# Patient Record
Sex: Male | Born: 1980 | Race: Black or African American | Hispanic: No | Smoking: Never smoker
Health system: Southern US, Community
[De-identification: ages and names within clinical notes are randomized; demographics above are authoritative.]

---

## 2011-03-07 ENCOUNTER — Emergency Department (HOSPITAL_COMMUNITY)
Admission: EM | Admit: 2011-03-07 | Discharge: 2011-03-07 | Disposition: A | Discharge status: Home / Self Care | Payer: Medicaid Other | Attending: Emergency Medicine | Admitting: Emergency Medicine

## 2011-03-07 ENCOUNTER — Emergency Department (HOSPITAL_COMMUNITY): Payer: Medicaid Other

## 2011-03-07 DIAGNOSIS — M25529 Pain in unspecified elbow: Secondary | ICD-10-CM | POA: Insufficient documentation

## 2011-03-07 DIAGNOSIS — S5000XA Contusion of unspecified elbow, initial encounter: Secondary | ICD-10-CM | POA: Insufficient documentation

## 2011-03-07 DIAGNOSIS — M25429 Effusion, unspecified elbow: Secondary | ICD-10-CM | POA: Insufficient documentation

## 2011-03-07 DIAGNOSIS — S52123A Displaced fracture of head of unspecified radius, initial encounter for closed fracture: Secondary | ICD-10-CM | POA: Insufficient documentation

## 2011-06-16 ENCOUNTER — Emergency Department (HOSPITAL_COMMUNITY)
Admission: EM | Admit: 2011-06-16 | Discharge: 2011-06-16 | Disposition: A | Discharge status: Home / Self Care | Payer: Self-pay | Attending: Emergency Medicine | Admitting: Emergency Medicine

## 2011-06-16 ENCOUNTER — Encounter (HOSPITAL_COMMUNITY): Payer: Self-pay

## 2011-06-16 DIAGNOSIS — R110 Nausea: Secondary | ICD-10-CM | POA: Insufficient documentation

## 2011-06-16 DIAGNOSIS — R300 Dysuria: Secondary | ICD-10-CM | POA: Insufficient documentation

## 2011-06-16 DIAGNOSIS — R109 Unspecified abdominal pain: Secondary | ICD-10-CM | POA: Insufficient documentation

## 2011-06-16 DIAGNOSIS — N3943 Post-void dribbling: Secondary | ICD-10-CM | POA: Insufficient documentation

## 2011-06-16 DIAGNOSIS — R3915 Urgency of urination: Secondary | ICD-10-CM | POA: Insufficient documentation

## 2011-06-16 DIAGNOSIS — R350 Frequency of micturition: Secondary | ICD-10-CM | POA: Insufficient documentation

## 2011-06-16 DIAGNOSIS — R197 Diarrhea, unspecified: Secondary | ICD-10-CM | POA: Insufficient documentation

## 2011-06-16 LAB — URINALYSIS, ROUTINE W REFLEX MICROSCOPIC
Glucose, UA: NEGATIVE mg/dL
Ketones, ur: NEGATIVE mg/dL
Protein, ur: 100 mg/dL — AB
pH: 6 (ref 5.0–8.0)

## 2011-06-16 LAB — URINE MICROSCOPIC-ADD ON

## 2011-06-16 LAB — GLUCOSE, CAPILLARY: Glucose-Capillary: 91 mg/dL (ref 70–99)

## 2011-06-16 MED ORDER — DICYCLOMINE HCL 10 MG PO CAPS: 10.0000 mg | ORAL_CAPSULE | Freq: Three times a day (TID) | ORAL | Status: AC

## 2011-06-16 MED ORDER — DICYCLOMINE HCL 10 MG PO CAPS
10.0000 mg | ORAL_CAPSULE | ORAL | Status: AC
  Administered 2011-06-16: 10 mg via ORAL
  Filled 2011-06-16: qty 1

## 2011-06-16 NOTE — ED Notes (Signed)
Pt states that for the past few days he has been having abdominal cramping and pain and has been also having intermittent constipation. Pt states that he has also been having frequency and urgency with urination. No meds pta.

## 2011-06-16 NOTE — ED Provider Notes (Signed)
History     CSN: [id number]  Arrival date & time 06/16/11  0714   First MD Initiated Contact with Patient 06/16/11 [phone]      Chief Complaint  Patient presents with  . Abdominal Pain    (Consider location/radiation/quality/duration/timing/severity/associated sxs/prior treatment) HPI Comments: Patient here with multiple complaints including frequent urination and urgency, states that when he goes that he sometimes dribbles, denies urethral discharge or burning.  Also here with episodic abdominal cramping - states this started about 3 days ago - states that he thought he was getting a stomach virus - denies fever, chills, reports nausea but no vomiting - reports diarrhea starting last night - is able to eat without difficulty.    Patient is a 31 y.o. male presenting with abdominal pain. The history is provided by the patient. No language interpreter was used.  Abdominal Pain The primary symptoms of the illness include abdominal pain, nausea, diarrhea and dysuria. The primary symptoms of the illness do not include fever, fatigue, shortness of breath, vomiting, hematemesis or hematochezia. The current episode started more than 2 days ago. The onset of the illness was gradual. The problem has been gradually worsening.  The dysuria is associated with urgency. The dysuria is not associated with hematuria or frequency.  The patient states that she believes she is currently not pregnant. The patient has not had a change in bowel habit. Additional symptoms associated with the illness include urgency. Symptoms associated with the illness do not include chills, anorexia, diaphoresis, heartburn, constipation, hematuria, frequency or back pain.    History reviewed. No pertinent past medical history.  History reviewed. No pertinent past surgical history.  History reviewed. No pertinent family history.  History  Substance Use Topics  . Smoking status: Never Smoker   . Smokeless tobacco: Not on file    . Alcohol Use: No      Review of Systems  Constitutional: Negative for fever, chills, diaphoresis and fatigue.  Respiratory: Negative for shortness of breath.   Gastrointestinal: Positive for nausea, abdominal pain and diarrhea. Negative for heartburn, vomiting, constipation, hematochezia, anorexia and hematemesis.  Genitourinary: Positive for dysuria and urgency. Negative for frequency and hematuria.  Musculoskeletal: Negative for back pain.  All other systems reviewed and are negative.    Allergies  Review of patient's allergies indicates no known allergies.  Home Medications  No current outpatient prescriptions on file.  BP 142/81  Pulse 80  Temp(Src) 97.9 F (36.6 C) (Oral)  Resp 20  SpO2 99%  Physical Exam  Nursing note and vitals reviewed. Constitutional: He appears well-developed and well-nourished. No distress.  HENT:  Head: Normocephalic and atraumatic.  Right Ear: External ear normal.  Left Ear: External ear normal.  Nose: Nose normal.  Mouth/Throat: Oropharynx is clear and moist. No oropharyngeal exudate.  Eyes: Conjunctivae are normal. Pupils are equal, round, and reactive to light. No scleral icterus.  Neck: Normal range of motion. Neck supple.  Cardiovascular: Normal rate, regular rhythm and normal heart sounds.  Exam reveals no gallop and no friction rub.   No murmur heard. Pulmonary/Chest: Effort normal and breath sounds normal. No respiratory distress. He exhibits no tenderness.  Abdominal: Soft. Bowel sounds are normal. He exhibits no distension. There is no tenderness.  Musculoskeletal: Normal range of motion.  Lymphadenopathy:    He has no cervical adenopathy.  Neurological: He is alert. No cranial nerve deficit.  Skin: Skin is warm and dry.  Psychiatric: He has a normal mood and affect. His behavior is  normal. Judgment and thought content normal.    ED Course  Procedures (including critical care time)   Labs Reviewed  URINALYSIS, ROUTINE  W REFLEX MICROSCOPIC  POCT CBG MONITORING   No results found. Results for orders placed during the hospital encounter of 06/16/11  URINALYSIS, ROUTINE W REFLEX MICROSCOPIC      Component Value Range   Color, Urine YELLOW  YELLOW    APPearance CLEAR  CLEAR    Specific Gravity, Urine 1.030  1.005 - 1.030    pH 6.0  5.0 - 8.0    Glucose, UA NEGATIVE  NEGATIVE (mg/dL)   Hgb urine dipstick LARGE (*) NEGATIVE    Bilirubin Urine NEGATIVE  NEGATIVE    Ketones, ur NEGATIVE  NEGATIVE (mg/dL)   Protein, ur 409 (*) NEGATIVE (mg/dL)   Urobilinogen, UA 0.2  0.0 - 1.0 (mg/dL)   Nitrite NEGATIVE  NEGATIVE    Leukocytes, UA NEGATIVE  NEGATIVE   GLUCOSE, CAPILLARY      Component Value Range   Glucose-Capillary 91  70 - 99 (mg/dL)  URINE MICROSCOPIC-ADD ON      Component Value Range   Squamous Epithelial / LPF RARE  RARE    Crystals CA OXALATE CRYSTALS (*) NEGATIVE    Urine-Other MUCOUS PRESENT     No results found.   Abdominal pain    MDM  The patient reports improvement in pain after the bentyl - do not believe this to be an acute abdomen.  Given the diarrhea, likely a colitis vs gastroenteritis picture - no fever or chills.  Urine without infection but does show calcium oxalate crystal and large blood - he is not complaining of back pain and although a kidney stone could be part of this picture, I am doubting this at this point.  Will prescribe the bentyl and he will folllow up with PCP.        Izola Price [place], [place] 06/16/11 [phone]

## 2011-06-16 NOTE — ED Provider Notes (Signed)
Medical screening examination/treatment/procedure(s) were performed by non-physician practitioner and as supervising physician I was immediately available for consultation/collaboration.  Nica Friske R. Dymphna Wadley, MD 06/16/11 1404 

## 2012-08-27 ENCOUNTER — Emergency Department: Payer: Self-pay | Admitting: Emergency Medicine

## 2012-08-28 ENCOUNTER — Emergency Department: Payer: Self-pay | Admitting: Emergency Medicine

## 2012-08-28 LAB — COMPREHENSIVE METABOLIC PANEL
Albumin: 4.1 g/dL (ref 3.4–5.0)
BUN: 15 mg/dL (ref 7–18)
Chloride: 107 mmol/L (ref 98–107)
Co2: 32 mmol/L (ref 21–32)
EGFR (Non-African Amer.): >60
Glucose: 110 mg/dL — ABNORMAL HIGH (ref 65–99)
Osmolality: 281 (ref 275–301)
Potassium: 3.5 mmol/L (ref 3.5–5.1)
Total Protein: 8.1 g/dL (ref 6.4–8.2)

## 2012-08-28 LAB — URINALYSIS, COMPLETE
Bilirubin,UR: NEGATIVE
Ketone: NEGATIVE
Leukocyte Esterase: NEGATIVE
Ph: 7 (ref 4.5–8.0)
Specific Gravity: 1.021 (ref 1.003–1.030)
Squamous Epithelial: 1
WBC UR: 4 /HPF (ref 0–5)

## 2012-08-28 LAB — DRUG SCREEN, URINE
Amphetamines, Ur Screen: NEGATIVE (ref ?–1000)
Barbiturates, Ur Screen: NEGATIVE (ref ?–200)
Cocaine Metabolite,Ur ~~LOC~~: NEGATIVE (ref ?–300)
MDMA (Ecstasy)Ur Screen: NEGATIVE (ref ?–500)
Methadone, Ur Screen: NEGATIVE (ref ?–300)
Tricyclic, Ur Screen: NEGATIVE (ref ?–1000)

## 2012-08-28 LAB — CBC
HCT: 47.5 % (ref 40.0–52.0)
MCV: 89 fL (ref 80–100)
RDW: 14.5 % (ref 11.5–14.5)
WBC: 5.7 10*3/uL (ref 3.8–10.6)

## 2012-08-28 LAB — ETHANOL: Ethanol: <3 mg/dL

## 2012-08-28 LAB — TSH: Thyroid Stimulating Horm: 4.24 u[IU]/mL

## 2013-08-13 ENCOUNTER — Emergency Department: Payer: Self-pay | Admitting: Emergency Medicine

## 2013-08-13 LAB — CBC WITH DIFFERENTIAL/PLATELET
Basophil #: 0.1 x10 3/mm 3
Basophil %: 0.9 %
Eosinophil #: 0.3 x10 3/mm 3
Eosinophil %: 3.1 %
HCT: 48.3 %
HGB: 16.2 g/dL
Lymphocyte %: 21.8 %
Lymphs Abs: 1.8 x10 3/mm 3
MCH: 30.2 pg
MCHC: 33.5 g/dL
MCV: 90 fL
Monocyte #: 0.7 "x10 3/mm "
Monocyte %: 8.3 %
Neutrophil #: 5.5 x10 3/mm 3
Neutrophil %: 65.9 %
Platelet: 244 x10 3/mm 3
RBC: 5.36 x10 6/mm 3
RDW: 14.3 %
WBC: 8.4 x10 3/mm 3

## 2013-08-13 LAB — URINALYSIS, COMPLETE
Bacteria: NONE SEEN
Bilirubin,UR: NEGATIVE
Glucose,UR: NEGATIVE mg/dL
Ketone: NEGATIVE
Leukocyte Esterase: NEGATIVE
Nitrite: NEGATIVE
Ph: 6
Protein: 100
RBC,UR: 1 /HPF
Specific Gravity: 1.02
Squamous Epithelial: <1
WBC UR: 1 /HPF

## 2013-08-13 LAB — COMPREHENSIVE METABOLIC PANEL
ALBUMIN: 3.8 g/dL (ref 3.4–5.0)
ALT: 16 U/L (ref 12–78)
AST: 19 U/L (ref 15–37)
Alkaline Phosphatase: 66 U/L
Anion Gap: 0 — ABNORMAL LOW (ref 7–16)
BUN: 7 mg/dL (ref 7–18)
Bilirubin,Total: 0.3 mg/dL (ref 0.2–1.0)
CO2: 32 mmol/L (ref 21–32)
Calcium, Total: 8.8 mg/dL (ref 8.5–10.1)
Chloride: 108 mmol/L — ABNORMAL HIGH (ref 98–107)
Creatinine: 1.06 mg/dL (ref 0.60–1.30)
EGFR (African American): >60
GLUCOSE: 99 mg/dL (ref 65–99)
OSMOLALITY: 277 (ref 275–301)
Potassium: 4 mmol/L (ref 3.5–5.1)
SODIUM: 140 mmol/L (ref 136–145)
TOTAL PROTEIN: 7.6 g/dL (ref 6.4–8.2)

## 2013-08-13 LAB — LIPASE, BLOOD: Lipase: 241 U/L (ref 73–393)

## 2013-11-25 ENCOUNTER — Emergency Department: Payer: Self-pay | Admitting: Emergency Medicine

## 2013-11-25 LAB — COMPREHENSIVE METABOLIC PANEL
ALBUMIN: 3.2 g/dL — AB (ref 3.4–5.0)
ALK PHOS: 70 U/L
ALT: 15 U/L (ref 12–78)
AST: 21 U/L (ref 15–37)
Anion Gap: 9 (ref 7–16)
BUN: 9 mg/dL (ref 7–18)
Bilirubin,Total: 0.2 mg/dL (ref 0.2–1.0)
CALCIUM: 9.2 mg/dL (ref 8.5–10.1)
CHLORIDE: 110 mmol/L — AB (ref 98–107)
CO2: 26 mmol/L (ref 21–32)
CREATININE: 1.06 mg/dL (ref 0.60–1.30)
EGFR (African American): >60
EGFR (Non-African Amer.): >60
Glucose: 101 mg/dL — ABNORMAL HIGH (ref 65–99)
OSMOLALITY: 288 (ref 275–301)
POTASSIUM: 3.9 mmol/L (ref 3.5–5.1)
SODIUM: 145 mmol/L (ref 136–145)
TOTAL PROTEIN: 6.5 g/dL (ref 6.4–8.2)

## 2013-11-25 LAB — URINALYSIS, COMPLETE
BILIRUBIN, UR: NEGATIVE
Bacteria: NONE SEEN
Glucose,UR: NEGATIVE mg/dL (ref 0–75)
KETONE: NEGATIVE
Leukocyte Esterase: NEGATIVE
Nitrite: NEGATIVE
PROTEIN: NEGATIVE
Ph: 7 (ref 4.5–8.0)
RBC,UR: 1 /HPF (ref 0–5)
SPECIFIC GRAVITY: 1.012 (ref 1.003–1.030)
Squamous Epithelial: NONE SEEN

## 2013-11-25 LAB — CBC
HCT: 42.2 % (ref 40.0–52.0)
HGB: 13.6 g/dL (ref 13.0–18.0)
MCH: 29.3 pg (ref 26.0–34.0)
MCHC: 32.4 g/dL (ref 32.0–36.0)
MCV: 91 fL (ref 80–100)
PLATELETS: 212 10*3/uL (ref 150–440)
RBC: 4.66 10*6/uL (ref 4.40–5.90)
RDW: 15 % — ABNORMAL HIGH (ref 11.5–14.5)
WBC: 6.1 10*3/uL (ref 3.8–10.6)

## 2013-11-25 LAB — LIPASE, BLOOD: Lipase: 113 U/L (ref 73–393)

## 2014-06-01 ENCOUNTER — Emergency Department: Payer: Self-pay | Admitting: Emergency Medicine

## 2014-06-01 LAB — URINALYSIS, COMPLETE
Bacteria: NONE SEEN
Bilirubin,UR: NEGATIVE
Glucose,UR: NEGATIVE mg/dL (ref 0–75)
KETONE: NEGATIVE
LEUKOCYTE ESTERASE: NEGATIVE
Nitrite: NEGATIVE
Ph: 6 (ref 4.5–8.0)
SQUAMOUS EPITHELIAL: NONE SEEN
Specific Gravity: 1.026 (ref 1.003–1.030)
WBC UR: 1 /HPF (ref 0–5)

## 2014-07-11 ENCOUNTER — Emergency Department: Payer: Self-pay | Admitting: Emergency Medicine

## 2014-07-12 LAB — GC/CHLAMYDIA PROBE AMP
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
## 2015-10-07 IMAGING — US ABDOMEN ULTRASOUND LIMITED
1 series · 14 of 25 positions shown · non-contrast
Comparison: None.

CLINICAL DATA: Right upper quadrant pain

EXAM:
US ABDOMEN LIMITED - RIGHT UPPER QUADRANT

[Series 1: abdomen ultrasound limited · 0.25mm/px · 14 of 56 slices shown]
[im 1/56]
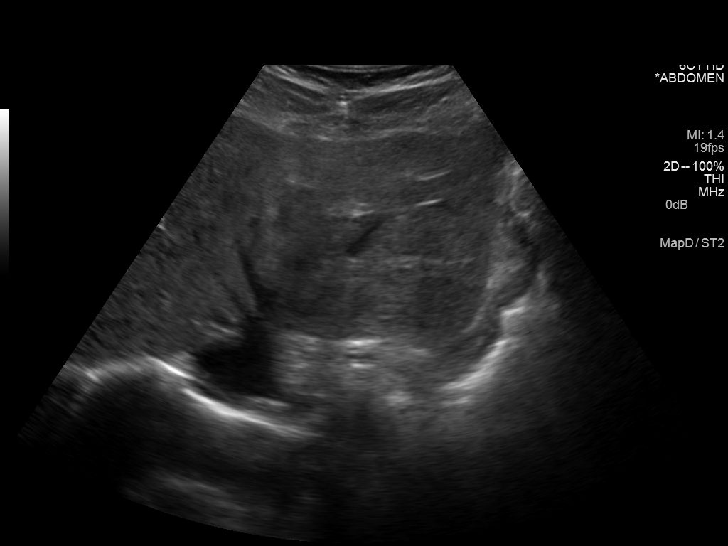
[im 5/56]
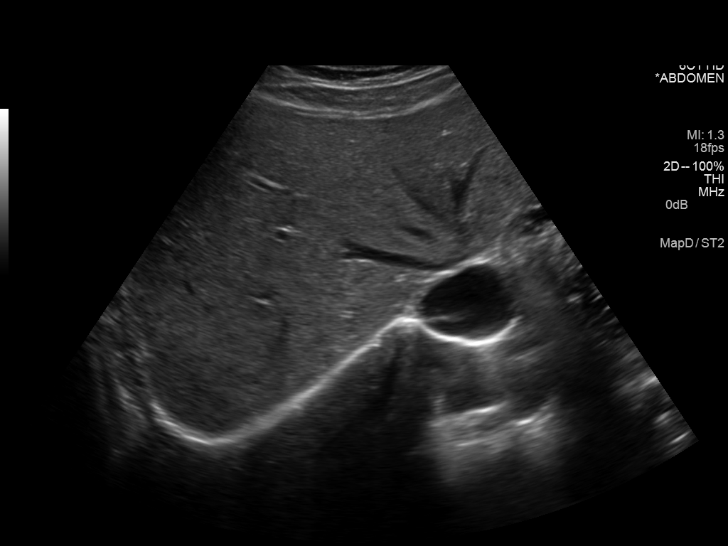
[im 10/56]
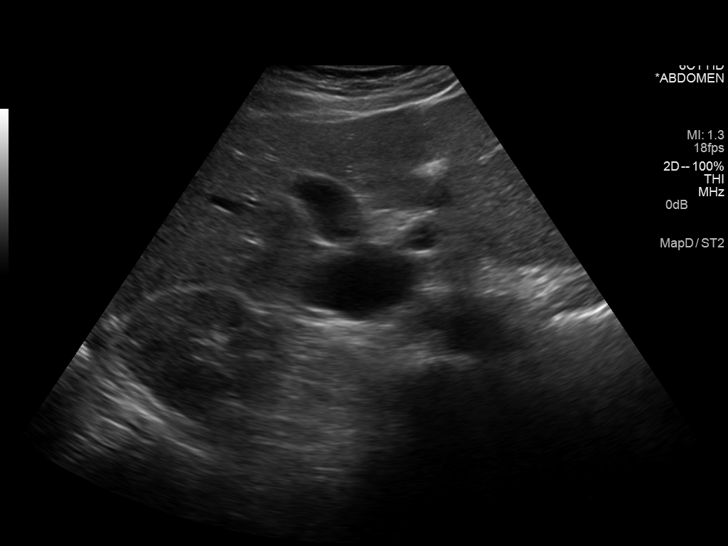
[im 14/56]
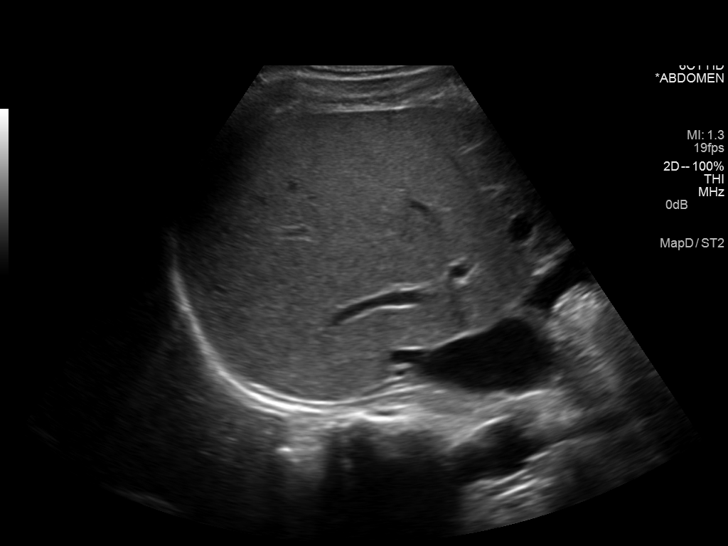
[im 19/56]
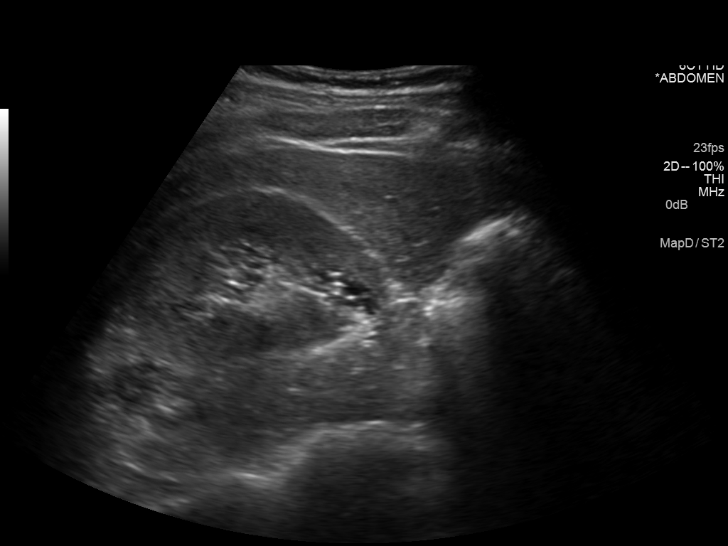
[im 21/56]
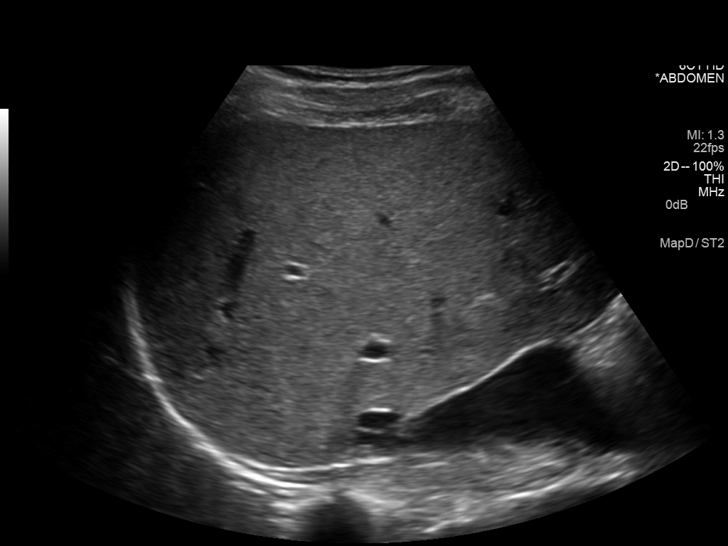
[im 26/56]
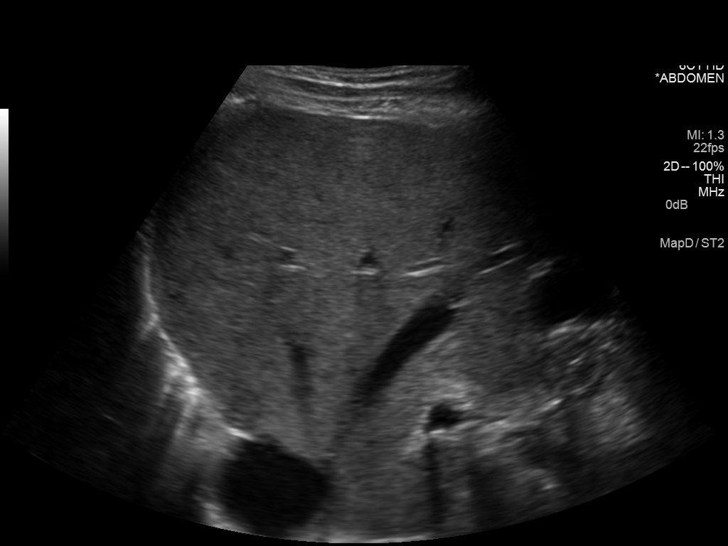
[im 30/56]
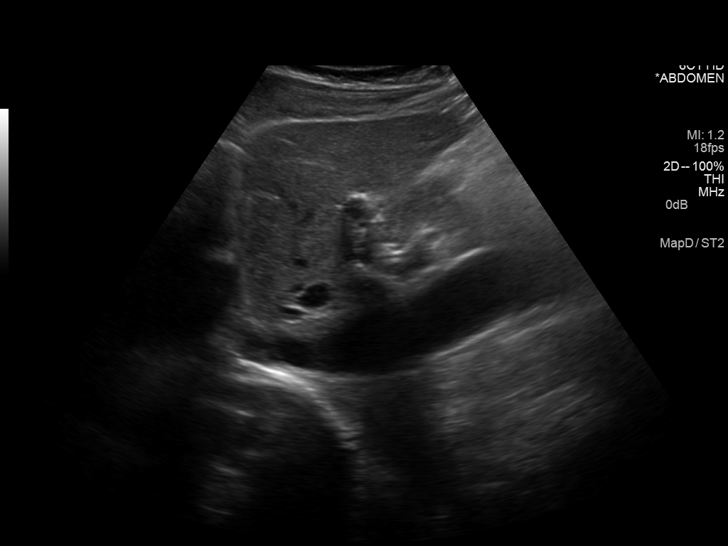
[im 35/56]
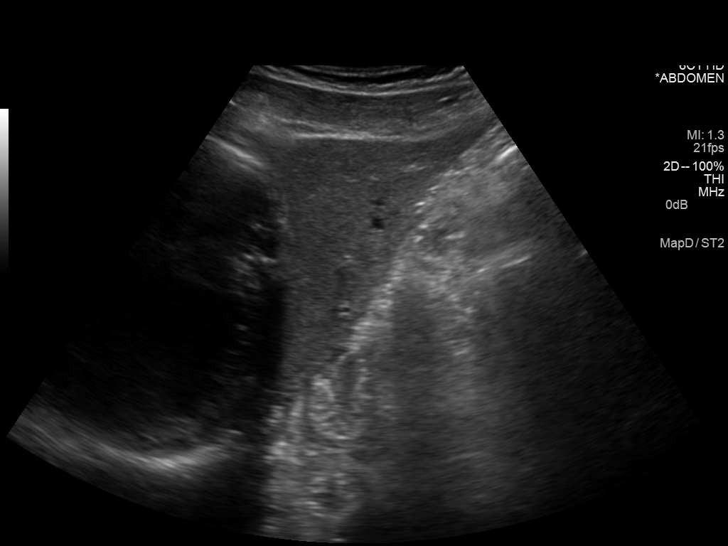
[im 37/56]
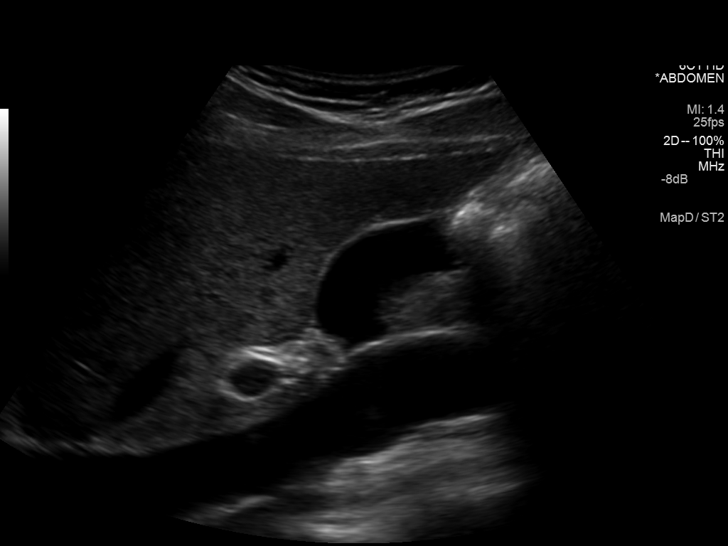
[im 42/56]
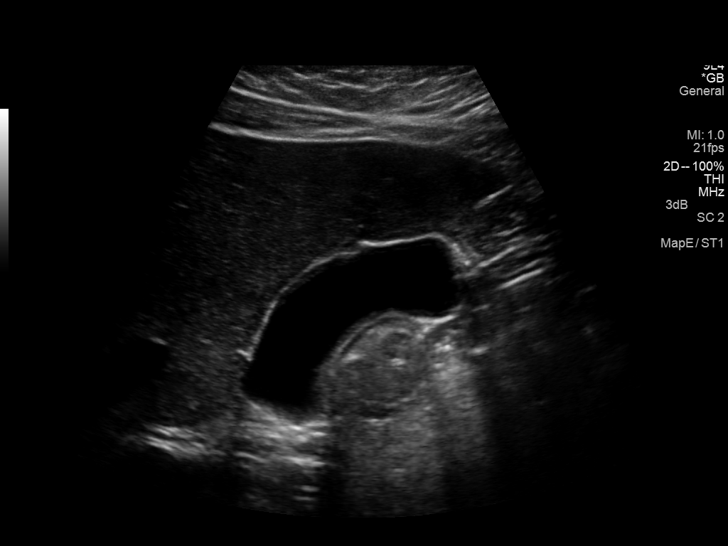
[im 46/56]
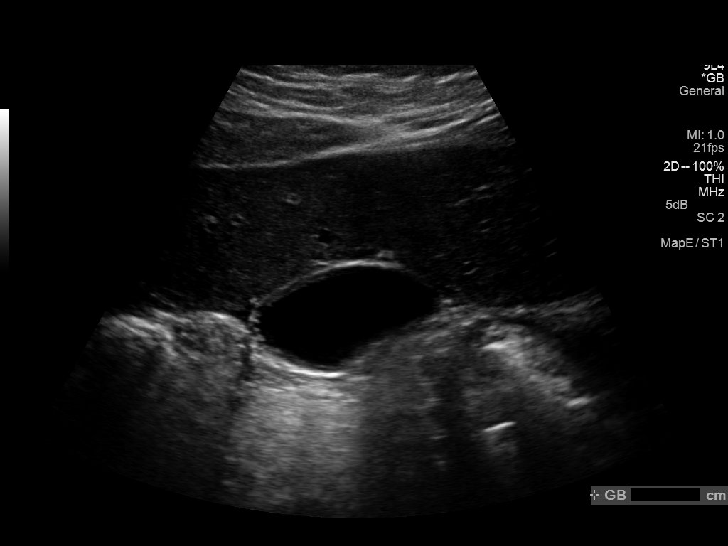
[im 51/56]
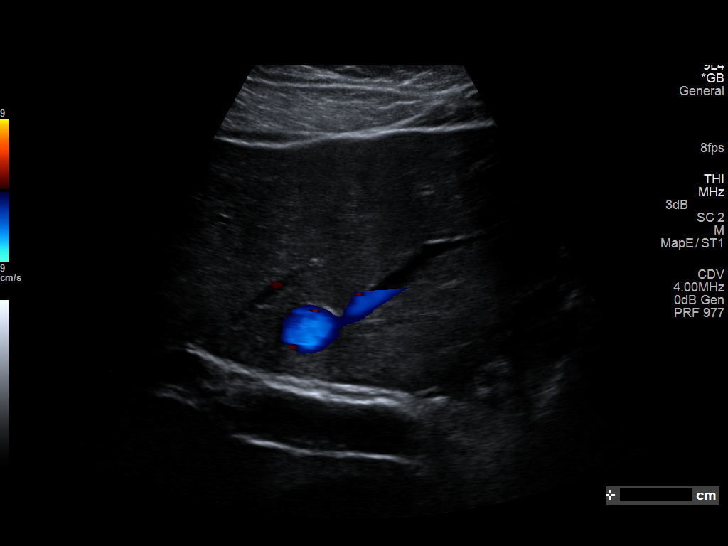
[im 56/56]
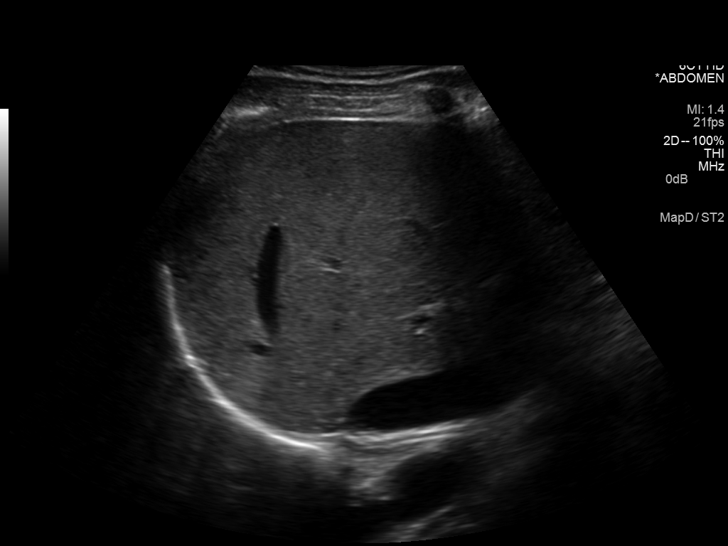

[14 of 25 positions shown; findings below may reference images not displayed]

FINDINGS: Gallbladder:

No gallstones or wall thickening visualized. No sonographic Murphy
sign noted.

Common bile duct:

Diameter: 2 mm

Liver:

No focal lesion identified. Within normal limits in parenchymal
echogenicity.
IMPRESSION: No acute finding by ultrasound

## 2017-07-19 ENCOUNTER — Emergency Department
Admission: EM | Admit: 2017-07-19 | Discharge: 2017-07-19 | Disposition: A | Discharge status: Home / Self Care | Payer: Self-pay | Attending: Emergency Medicine | Admitting: Emergency Medicine

## 2017-07-19 ENCOUNTER — Encounter: Payer: Self-pay | Admitting: Emergency Medicine

## 2017-07-19 ENCOUNTER — Other Ambulatory Visit: Payer: Self-pay

## 2017-07-19 ENCOUNTER — Emergency Department: Payer: Self-pay

## 2017-07-19 DIAGNOSIS — K122 Cellulitis and abscess of mouth: Secondary | ICD-10-CM | POA: Insufficient documentation

## 2017-07-19 LAB — CBC WITH DIFFERENTIAL/PLATELET
BASOS ABS: 0.1 10*3/uL (ref 0–0.1)
Basophils Relative: 1 %
Eosinophils Absolute: 0.1 10*3/uL (ref 0–0.7)
Eosinophils Relative: 2 %
HEMATOCRIT: 39.3 % — AB (ref 40.0–52.0)
HEMOGLOBIN: 13.3 g/dL (ref 13.0–18.0)
LYMPHS ABS: 1.6 10*3/uL (ref 1.0–3.6)
LYMPHS PCT: 31 %
MCH: 30.8 pg (ref 26.0–34.0)
MCHC: 33.9 g/dL (ref 32.0–36.0)
MCV: 90.7 fL (ref 80.0–100.0)
Monocytes Absolute: 0.4 10*3/uL (ref 0.2–1.0)
Monocytes Relative: 8 %
NEUTROS ABS: 2.9 10*3/uL (ref 1.4–6.5)
NEUTROS PCT: 58 %
Platelets: 255 10*3/uL (ref 150–440)
RBC: 4.33 MIL/uL — AB (ref 4.40–5.90)
RDW: 15.3 % — ABNORMAL HIGH (ref 11.5–14.5)
WBC: 5 10*3/uL (ref 3.8–10.6)

## 2017-07-19 LAB — BASIC METABOLIC PANEL
ANION GAP: 8 (ref 5–15)
BUN: 12 mg/dL (ref 6–20)
CHLORIDE: 107 mmol/L (ref 101–111)
CO2: 25 mmol/L (ref 22–32)
Calcium: 8.9 mg/dL (ref 8.9–10.3)
Creatinine, Ser: 1.01 mg/dL (ref 0.61–1.24)
GFR calc Af Amer: >60 mL/min (ref >60)
Glucose, Bld: 123 mg/dL — ABNORMAL HIGH (ref 65–99)
Potassium: 3.8 mmol/L (ref 3.5–5.1)
SODIUM: 140 mmol/L (ref 135–145)

## 2017-07-19 MED ORDER — IOPAMIDOL (ISOVUE-300) INJECTION 61%
75.0000 mL | Freq: Once | INTRAVENOUS | Status: AC | PRN
  Administered 2017-07-19: 75 mL via INTRAVENOUS
  Filled 2017-07-19: qty 75

## 2017-07-19 MED ORDER — CLINDAMYCIN HCL 150 MG PO CAPS: 300.0000 mg | ORAL_CAPSULE | Freq: Three times a day (TID) | ORAL | 0 refills | Status: AC

## 2017-07-19 NOTE — ED Notes (Signed)
Pt verbalized understanding of discharge instructions. NAD at this time. 
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
## 2017-07-19 NOTE — Discharge Instructions (Signed)
Follow-up with your regular doctor if you are not better in 5-7 days.  Take Tylenol and ibuprofen to help decrease the swelling.  Apply ice to the area also.  Due to the inflammation on the muscle tissue and the recent abscess we are repeating an antibiotic for you.  Be sure to eat yogurt while you take this medication so you will not get diarrhea.
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
## 2017-07-19 NOTE — ED Triage Notes (Signed)
Pt with right side jaw pain. Abscess back in jan.

## 2017-10-03 ENCOUNTER — Other Ambulatory Visit: Payer: Self-pay

## 2017-10-03 ENCOUNTER — Emergency Department
Admission: EM | Admit: 2017-10-03 | Discharge: 2017-10-03 | Disposition: A | Discharge status: Home / Self Care | Payer: Self-pay | Attending: Emergency Medicine | Admitting: Emergency Medicine

## 2017-10-03 ENCOUNTER — Encounter: Payer: Self-pay | Admitting: Emergency Medicine

## 2017-10-03 DIAGNOSIS — R300 Dysuria: Secondary | ICD-10-CM | POA: Insufficient documentation

## 2017-10-03 DIAGNOSIS — Z202 Contact with and (suspected) exposure to infections with a predominantly sexual mode of transmission: Secondary | ICD-10-CM | POA: Insufficient documentation

## 2017-10-03 DIAGNOSIS — Z113 Encounter for screening for infections with a predominantly sexual mode of transmission: Secondary | ICD-10-CM | POA: Insufficient documentation

## 2017-10-03 LAB — URINALYSIS, COMPLETE (UACMP) WITH MICROSCOPIC
BILIRUBIN URINE: NEGATIVE
Glucose, UA: NEGATIVE mg/dL
KETONES UR: NEGATIVE mg/dL
Leukocytes, UA: NEGATIVE
Nitrite: NEGATIVE
PH: 5 (ref 5.0–8.0)
Protein, ur: >=300 mg/dL — AB
Specific Gravity, Urine: 1.035 — ABNORMAL HIGH (ref 1.005–1.030)

## 2017-10-03 LAB — CHLAMYDIA/NGC RT PCR (ARMC ONLY)
Chlamydia Tr: NOT DETECTED
N gonorrhoeae: DETECTED — AB

## 2017-10-03 MED ORDER — AZITHROMYCIN 500 MG PO TABS
1000.0000 mg | ORAL_TABLET | Freq: Once | ORAL | Status: AC
  Administered 2017-10-03: 1000 mg via ORAL
  Filled 2017-10-03: qty 2

## 2017-10-03 MED ORDER — CEFTRIAXONE SODIUM 250 MG IJ SOLR
250.0000 mg | Freq: Once | INTRAMUSCULAR | Status: AC
  Administered 2017-10-03: 250 mg via INTRAMUSCULAR
  Filled 2017-10-03: qty 250
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
## 2017-10-03 NOTE — ED Provider Notes (Signed)
[facility] Emergency Department Provider Note  ____________________________________________  Time seen: Approximately 5:34 PM  I have reviewed the triage vital signs and the nursing notes.   HISTORY  Chief Complaint Exposure to STD    HPI [name] is a 37 y.o. male who presents emergency department requesting testing and treatment for STD.  Patient reports that his sexual partner was informed by previous partner that he was positive for either gonorrhea or chlamydia.  Patient is unsure which of the 2 his partner was exposed to.  Patient reports that he had burning sensation with urination today.  No discharge.  No genital lesions.  Patient is requesting testing at this time.  Patient denies any systemic complaints of fevers or chills, abdominal pain, nausea vomiting.  No medication for this complaint prior to arrival.  No other complaints.  History reviewed. No pertinent past medical history.  There are no active problems to display for this patient.   History reviewed. No pertinent surgical history.  Prior to Admission medications   Medication Sig Start Date End Date Taking? Authorizing Provider  clindamycin (CLEOCIN) 150 MG capsule Take 2 capsules (300 mg total) by mouth 3 (three) times daily. 07/19/17   Fisher, Roselyn Bering, PA-C  dicyclomine (BENTYL) 10 MG capsule Take 1 capsule (10 mg total) by mouth 4 (four) times daily -  before meals and at bedtime. 06/16/11 06/15/12  Cherrie Distance, PA-C    Allergies Patient has no known allergies.  No family history on file.  Social History Social History   Tobacco Use  . Smoking status: Never Smoker  . Smokeless tobacco: Never Used  Substance Use Topics  . Alcohol use: No  . Drug use: No     Review of Systems  Constitutional: No fever/chills Eyes: No visual changes.  Cardiovascular: no chest pain. Respiratory: no cough. No SOB. Gastrointestinal: No abdominal pain.  No nausea, no vomiting.  No  diarrhea.  No constipation. Genitourinary: Positive for dysuria.  No penile discharge.  No hematuria Musculoskeletal: Negative for musculoskeletal pain. Skin: Negative for rash, abrasions, lacerations, ecchymosis. Neurological: Negative for headaches, focal weakness or numbness. 10-point ROS otherwise negative.  ____________________________________________   PHYSICAL EXAM:  VITAL SIGNS: ED Triage Vitals [10/03/17 1732]  Enc Vitals Group     BP 113/66     Pulse      Resp 20     Temp 98.3 F (36.8 C)     Temp Source Oral     SpO2 99 %     Weight 200 lb (90.7 kg)     Height  (1.778 m)     Head Circumference      Peak Flow      Pain Score 10     Pain Loc      Pain Edu?      Excl. in GC?      Constitutional: Alert and oriented. Well appearing and in no acute distress. Eyes: Conjunctivae are normal. PERRL. EOMI. Head: Atraumatic. Neck: No stridor.    Cardiovascular: Normal rate, regular rhythm. Normal S1 and S2.  Good peripheral circulation. Respiratory: Normal respiratory effort without tachypnea or retractions. Lungs CTAB. Good air entry to the bases with no decreased or absent breath sounds. Gastrointestinal: Bowel sounds 4 quadrants. Soft and nontender to palpation. No guarding or rigidity. No palpable masses. No distention. No CVA tenderness. Genitourinary: No lesions, chancres, visible abnormalities. Musculoskeletal: Full range of motion to all extremities. No gross deformities appreciated. Neurologic:  Normal speech  and language. No gross focal neurologic deficits are appreciated.  Skin:  Skin is warm, dry and intact. No rash noted. Psychiatric: Mood and affect are normal. Speech and behavior are normal. Patient exhibits appropriate insight and judgement.   ____________________________________________   LABS (all labs ordered are listed, but only abnormal results are displayed)  Labs Reviewed  CHLAMYDIA/NGC RT PCR ([facility] ONLY)  URINALYSIS,  COMPLETE ([facility]) WITH MICROSCOPIC   ____________________________________________  EKG   ____________________________________________  RADIOLOGY   No results found.  ____________________________________________    PROCEDURES  Procedure(s) performed:    Procedures    Medications  cefTRIAXone (ROCEPHIN) injection 250 mg (has no administration in time range)  azithromycin (ZITHROMAX) tablet 1,000 mg (has no administration in time range)     ____________________________________________   INITIAL IMPRESSION / ASSESSMENT AND PLAN / ED COURSE  Pertinent labs & imaging results that were available during my care of the patient were reviewed by me and considered in my medical decision making (see chart for details).  Review of the [place] CSRS was performed in accordance of the [facility] prior to dispensing any controlled drugs.     Patient's diagnosis is consistent with exposure STD, dysuria.  Patient states that his ex-wife and he reconnected.  His partner was exposed to potential STD, gonorrhea or chlamydia.  He is unsure whether it was gonorrhea chlamydia.  This morning, patient woke up with dysuria.  He is requesting testing and treatment for possible STD.  At this time, testing will be sent to the lab.  Patient requests treatment and will call back for results.  At this time, patient is treated with Rocephin and Zithromax.  If test are positive, he will follow-up with health department in 3 weeks for repeat testing.  No prescriptions on discharge.  Patient will follow-up with health department or primary care as needed..  Patient is given ED precautions to return to the ED for any worsening or new symptoms.     ____________________________________________  FINAL CLINICAL IMPRESSION(S) / ED DIAGNOSES  Final diagnoses:  Exposure to STD  Screen for STD (sexually transmitted disease)  Dysuria      NEW MEDICATIONS STARTED DURING THIS VISIT:  ED Discharge Orders    None           This chart was dictated using voice recognition software/Dragon. Despite best efforts to proofread, errors can occur which can change the meaning. Any change was purely unintentional.    Racheal Patches, PA-C 10/03/17 1745    Minna Antis, MD 10/03/17 [phone]

## 2017-10-04 ENCOUNTER — Telehealth: Payer: Self-pay | Admitting: Emergency Medicine
